# Patient Record
Sex: Male | Born: 2000 | Hispanic: No | Marital: Single | Smoking: Never smoker
Health system: Southern US, Community
[De-identification: ages and names within clinical notes are randomized; demographics above are authoritative.]

## PROBLEM LIST (undated history)

## (undated) DIAGNOSIS — Z789 Other specified health status: Secondary | ICD-10-CM

## (undated) DIAGNOSIS — A281 Cat-scratch disease: Secondary | ICD-10-CM

## (undated) DIAGNOSIS — T300 Burn of unspecified body region, unspecified degree: Secondary | ICD-10-CM

## (undated) DIAGNOSIS — S52501A Unspecified fracture of the lower end of right radius, initial encounter for closed fracture: Secondary | ICD-10-CM

## (undated) DIAGNOSIS — S52601A Unspecified fracture of lower end of right ulna, initial encounter for closed fracture: Secondary | ICD-10-CM

## (undated) HISTORY — DX: Burn of unspecified body region, unspecified degree: T30.0

## (undated) HISTORY — PX: OTHER SURGICAL HISTORY: SHX169

## (undated) HISTORY — DX: Unspecified fracture of lower end of right ulna, initial encounter for closed fracture: S52.601A

## (undated) HISTORY — DX: Other specified health status: Z78.9

## (undated) HISTORY — DX: Unspecified fracture of the lower end of right radius, initial encounter for closed fracture: S52.501A

---
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
## 2022-07-09 ENCOUNTER — Telehealth: Payer: Self-pay

## 2022-07-09 NOTE — Telephone Encounter (Signed)
LVM for patient to call back to schedule apt. AS, CMA 

## 2022-09-10 ENCOUNTER — Ambulatory Visit (INDEPENDENT_AMBULATORY_CARE_PROVIDER_SITE_OTHER): Payer: Medicaid Other | Admitting: Pediatrics

## 2022-09-10 ENCOUNTER — Other Ambulatory Visit (HOSPITAL_COMMUNITY)
Admission: RE | Admit: 2022-09-10 | Discharge: 2022-09-10 | Disposition: A | Discharge status: Home / Self Care | Payer: Medicaid Other | Source: Ambulatory Visit | Attending: Pediatrics | Admitting: Pediatrics

## 2022-09-10 ENCOUNTER — Encounter: Payer: Self-pay | Admitting: Pediatrics

## 2022-09-10 ENCOUNTER — Ambulatory Visit (INDEPENDENT_AMBULATORY_CARE_PROVIDER_SITE_OTHER): Payer: Medicaid Other | Admitting: Licensed Clinical Social Worker

## 2022-09-10 VITALS — BP 110/74 | HR 94 | Ht 64.45 in | Wt 126.4 lb

## 2022-09-10 DIAGNOSIS — Z1339 Encounter for screening examination for other mental health and behavioral disorders: Secondary | ICD-10-CM

## 2022-09-10 DIAGNOSIS — Z1331 Encounter for screening for depression: Secondary | ICD-10-CM

## 2022-09-10 DIAGNOSIS — Z113 Encounter for screening for infections with a predominantly sexual mode of transmission: Secondary | ICD-10-CM | POA: Insufficient documentation

## 2022-09-10 DIAGNOSIS — Z6821 Body mass index (BMI) 21.0-21.9, adult: Secondary | ICD-10-CM

## 2022-09-10 DIAGNOSIS — F4320 Adjustment disorder, unspecified: Secondary | ICD-10-CM

## 2022-09-10 DIAGNOSIS — F439 Reaction to severe stress, unspecified: Secondary | ICD-10-CM

## 2022-09-10 DIAGNOSIS — Z114 Encounter for screening for human immunodeficiency virus [HIV]: Secondary | ICD-10-CM | POA: Diagnosis not present

## 2022-09-10 DIAGNOSIS — Z0001 Encounter for general adult medical examination with abnormal findings: Secondary | ICD-10-CM

## 2022-09-10 LAB — POCT RAPID HIV: Rapid HIV, POC: NEGATIVE
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
## 2022-09-10 NOTE — Patient Instructions (Signed)
Adult Primary Care Clinics Name Criteria Services   [facility]  Address: [address] [place], [place] [address]  Phone: [phone] Hours: Monday - Friday 9 AM -6 PM  Types of insurance accepted:  Commercial insurance [facility] (orange card) Medicaid Medicare Uninsured  Language services:  Video and phone interpreters available   Ages 18 and older    Adult primary care Onsite pharmacy Integrated behavioral health Financial assistance counseling Walk-in hours for established patients  Financial assistance counseling hours: Tuesdays 2:00PM - 5:00PM  Thursday 8:30AM - 4:30PM  Space is limited, 10 on Tuesday and 20 on Thursday. It's on first come first serve basis  Name Criteria Services   [facility]  Address: [address] [place], [place] [address]  Phone: [phone]  Hours: Monday - Friday 8:30 AM - 5 PM  Types of insurance accepted:  Commercial insurance Medicaid Medicare Uninsured  Language services:  Video and phone interpreters available   All ages - newborn to adult   Primary care for all ages (children and adults) Integrated behavioral health Nutritionist Financial assistance counseling   Name Criteria Services   [facility]  Located on the ground floor of [facility]  Address: [address]  [place],  [place]  [address]  Phone: [phone]  Hours: Monday - Friday 8:15 AM - 5 PM  Types of insurance accepted:  Commercial insurance Medicaid Medicare Uninsured  Language services:  Video and phone interpreters available   Ages 18 and older   Adult primary care Nutritionist Certified Diabetes Educator  Integrated behavioral health Financial assistance counseling   Name Criteria Services   [facility]  Address: [address] [place], [place] [address]  Phone:  [phone]  Hours: Monday - Friday 8:30 AM - 5 PM    Types of insurance accepted:  Commercial insurance Medicaid Medicare Uninsured  Language services:  Video and phone interpreters available   All ages - newborn to adult   Primary care for all ages (children and adults) Integrated behavioral health Financial assistance counseling    

## 2022-09-11 LAB — URINE CYTOLOGY ANCILLARY ONLY
Chlamydia: NEGATIVE
Comment: NEGATIVE
Comment: NORMAL
Neisseria Gonorrhea: NEGATIVE

## 2022-09-22 ENCOUNTER — Ambulatory Visit: Payer: Medicaid Other | Admitting: Licensed Clinical Social Worker

## 2022-09-22 NOTE — BH Specialist Note (Deleted)
Integrated Behavioral Health Follow Up In-Person Visit  MRN: [id number] Name: Colin Dean  Number of Integrated Behavioral Health Clinician visits: 1- Initial Visit  Session Start time: 1525   Session End time: 1600  Total time in minutes: 35   Types of Service: {CHL AMB TYPE OF SERVICE:[phone]}  Interpretor:{yes WU:[id number]} Interpretor Name and Language: ***  Subjective: Colin Dean is a 22 y.o. male accompanied by {Patient accompanied by:[id number]} Patient was referred by *** for ***. Patient reports the following symptoms/concerns: *** Duration of problem: ***; Severity of problem: {Mild/Moderate/Severe:20260}  Objective: Mood: {BHH MOOD:22306} and Affect: {BHH AFFECT:22307} Risk of harm to self or others: {CHL AMB BH Suicide Current Mental Status:21022748}  Life Context: Family and Social: *** School/Work: *** Self-Care: *** Life Changes: ***  Patient and/or Family's Strengths/Protective Factors: {CHL AMB BH PROTECTIVE [phone]}  Goals Addressed: Patient will:  Reduce symptoms of: {IBH Symptoms:21014056}   Increase knowledge and/or ability of: {IBH Patient Tools:21014057}   Demonstrate ability to: {IBH Goals:21014053}  Progress towards Goals: {CHL AMB BH PROGRESS TOWARDS [phone]}  Interventions: Interventions utilized:  {IBH Interventions:21014054} Standardized Assessments completed: {IBH Screening Tools:21014051}  Patient and/or Family Response: ***  Patient Centered Plan: Patient is on the following Treatment Plan(s): *** Assessment: Patient currently experiencing ***.   Patient may benefit from ***.  Plan: Follow up with behavioral health clinician on : *** Behavioral recommendations: *** Referral(s): {IBH Referrals:21014055} "From scale of 1-10, how likely are you to follow plan?": ***  Isabelle Course, [facility]

## 2024-01-29 ENCOUNTER — Other Ambulatory Visit: Payer: Self-pay

## 2024-01-29 ENCOUNTER — Encounter (HOSPITAL_COMMUNITY): Payer: Self-pay

## 2024-01-29 ENCOUNTER — Emergency Department (HOSPITAL_COMMUNITY)
Admission: EM | Admit: 2024-01-29 | Discharge: 2024-01-29 | Disposition: A | Discharge status: Home / Self Care | Attending: Emergency Medicine | Admitting: Emergency Medicine

## 2024-01-29 DIAGNOSIS — R509 Fever, unspecified: Secondary | ICD-10-CM | POA: Insufficient documentation

## 2024-01-29 LAB — RESP PANEL BY RT-PCR (RSV, FLU A&B, COVID)  RVPGX2
Influenza A by PCR: NEGATIVE
Influenza B by PCR: NEGATIVE
Resp Syncytial Virus by PCR: NEGATIVE
SARS Coronavirus 2 by RT PCR: NEGATIVE

## 2024-01-29 NOTE — ED Triage Notes (Signed)
 First Nurse Note: Pt with fever, HA, vomiting that started last night. Pt states he is here for a work note because they sent him home.

## 2024-01-29 NOTE — ED Provider Triage Note (Signed)
 Emergency Medicine Provider Triage Evaluation Note  Colin Dean , a 23 y.o. male  was evaluated in triage.  Pt complains of reportedly mild frontal headache, runny nose, nasal congestion, and nausea primarily with a few episodes of emesis.  Denies any belly pain, diarrhea, constipation.  Patient reports objective fever but mainly had bodyaches and had trouble sleeping last night.  Brother and dad at home sick with similar symptoms.  Denies any neck pain or stiffness  Review of Systems  Positive:  Negative:   Physical Exam  BP 125/82   Pulse 95   Temp 98.7 F (37.1 C) (Oral)   Resp 14   Ht 5' 4 (1.626 m)   Wt 56.7 kg   SpO2 97%   BMI 21.46 kg/m  Gen:   Awake, no distress   Resp:  Normal effort  MSK:   Moves extremities without difficulty  Other:    Medical Decision Making  Medically screening exam initiated at 1:30 PM.  Appropriate orders placed.  Vikas Wegmann [place] was informed that the remainder of the evaluation will be completed by another provider, this initial triage assessment does not replace that evaluation, and the importance of remaining in the ED until their evaluation is complete. Rester swab ordered    Bernis Ernst, PA-C 01/29/24 1333

## 2024-01-29 NOTE — ED Notes (Signed)
Written and verbal discharge instructions administered. Pt denies any questions or concerns at this time.

## 2024-01-29 NOTE — ED Provider Notes (Signed)
 [place] EMERGENCY DEPARTMENT AT [facility] Provider Note   CSN: [id number] Arrival date & time: 01/29/24  1236     Patient presents with: Fever   Colin Dean is a 23 y.o. male.   Patient to ED with complaint of fever x 1-2 days. He reports mild body aches but no respiratory symptoms, vomiting, diarrhea. He reports similar symptoms going through his household with multiple family members. He is eating and drinking well. He denies headache and vomiting.   The history is provided by the patient. No language interpreter was used.  Fever      Prior to Admission medications   Medication Sig Start Date End Date Taking? Authorizing Provider  Clindamycin -Benzoyl Per, Refr, gel Thin topical layer 1-2 times a day Patient not taking: Reported on 09/10/2022 04/29/20   Leta Crazier, MD  RETIN-A  MICRO 0.1 % gel Apply topically at bedtime. Patient not taking: Reported on 09/10/2022 05/07/20   Leta Crazier, MD    Allergies: Patient has no known allergies.    Review of Systems  Constitutional:  Positive for fever.    Updated Vital Signs BP 125/82   Pulse 95   Temp 98.7 F (37.1 C) (Oral)   Resp 14   Ht 5' 4 (1.626 m)   Wt 56.7 kg   SpO2 97%   BMI 21.46 kg/m   Physical Exam Vitals and nursing note reviewed.  Constitutional:      Appearance: He is well-developed.  HENT:     Head: Normocephalic.     Mouth/Throat:     Mouth: Mucous membranes are moist.  Cardiovascular:     Rate and Rhythm: Normal rate and regular rhythm.     Heart sounds: No murmur heard. Pulmonary:     Effort: Pulmonary effort is normal.     Breath sounds: Normal breath sounds. No wheezing, rhonchi or rales.  Abdominal:     Palpations: Abdomen is soft.     Tenderness: There is no abdominal tenderness. There is no guarding or rebound.  Musculoskeletal:        General: Normal range of motion.     Cervical back: Normal range of motion and neck supple.  Skin:    General: Skin  is warm and dry.  Neurological:     General: No focal deficit present.     Mental Status: He is alert and oriented to person, place, and time.     (all labs ordered are listed, but only abnormal results are displayed) Labs Reviewed  RESP PANEL BY RT-PCR (RSV, FLU A&B, COVID)  [id number]    EKG: None  Radiology: No results found.   Procedures   Medications Ordered in the ED - No data to display  Clinical Course as of 01/29/24 1745  Sat Jan 29, 2024  1743 Patient with fever and bodyaches, denies other symptoms. Specifically, no vomiting, headache, cough, diarrhea. He was asked to leave work today and requires a note to return. Viral panel negative. VSS, afebrile. Otherwise healthy 23 yo patient who is felt appropriate for discharge home.  [SU]    Clinical Course User Index [SU] Odell Balls, PA-C                                 Medical Decision Making       Final diagnoses:  Febrile illness    ED Discharge Orders     None  Odell Balls, PA-C 01/29/24 1745    Elnor Jayson LABOR, DO 02/04/24 1600

## 2024-01-29 NOTE — Discharge Instructions (Signed)
 Take Tylenol and or ibuprofen  for fever. Drink lots of fluids and rest. Follow up with your doctor if needed for persistent symptoms.

## 2024-01-30 ENCOUNTER — Ambulatory Visit (HOSPITAL_COMMUNITY)
Admission: EM | Admit: 2024-01-30 | Discharge: 2024-01-30 | Disposition: A | Discharge status: Home / Self Care | Attending: Nurse Practitioner | Admitting: Nurse Practitioner

## 2024-01-30 ENCOUNTER — Encounter (HOSPITAL_COMMUNITY): Payer: Self-pay

## 2024-01-30 DIAGNOSIS — B084 Enteroviral vesicular stomatitis with exanthem: Secondary | ICD-10-CM

## 2024-01-30 MED ORDER — HYDROCORTISONE 1 % EX CREA: TOPICAL_CREAM | CUTANEOUS | 0 refills | Status: AC

## 2024-01-30 MED ORDER — DIPHENHYDRAMINE HCL 25 MG PO TABS: 25.0000 mg | ORAL_TABLET | Freq: Four times a day (QID) | ORAL | 0 refills | Status: AC | PRN

## 2024-01-30 NOTE — ED Triage Notes (Signed)
 Patient presents to the office for rash on both hands and feet x 2 days.

## 2024-01-30 NOTE — Discharge Instructions (Addendum)
 You were seen today for a rash that is consistent with hand-foot-and-mouth disease (HFMD), which is a contagious viral illness.   It usually improves on its own within 7 to 10 days. Because this is a viral infection, antibiotics will not help. At home, make sure you drink plenty of fluids to stay hydrated. Cold drinks, popsicles, smoothies, and soft foods may feel more comfortable if you develop mouth sores. Avoid acidic, spicy, or carbonated foods and drinks if they cause irritation. Get plenty of rest and limit close contact with others until you have been fever-free for at least 24 hours without fever-reducing medication, are feeling better, and any open blisters have dried or scabbed over.  You may use acetaminophen (Tylenol) or ibuprofen  (Motrin /Advil ) as needed for fever, pain, headache, or body aches, following package instructions. Cool compresses, oatmeal baths, or anti-itch lotions such as calamine may also help with symptom relief.   Follow up with your primary care provider if symptoms are not improving within a week, if you have worsening mouth pain that makes it hard to drink, or if the rash becomes painful, significantly more widespread, or shows signs of infection. Go to the emergency department right away if you develop signs of dehydration such as very dry mouth, dizziness, dark urine or not urinating for 8-10 hours, inability to drink fluids, severe headache or neck stiffness, chest pain, trouble breathing, persistent high fever that doesn't improve with medication, or if your condition rapidly worsens.
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
## 2024-02-05 ENCOUNTER — Encounter (HOSPITAL_COMMUNITY): Payer: Self-pay

## 2024-02-05 ENCOUNTER — Ambulatory Visit (HOSPITAL_COMMUNITY): Admission: EM | Admit: 2024-02-05 | Discharge: 2024-02-05 | Disposition: A | Discharge status: Home / Self Care

## 2024-02-05 DIAGNOSIS — B084 Enteroviral vesicular stomatitis with exanthem: Secondary | ICD-10-CM | POA: Diagnosis not present

## 2024-02-05 NOTE — ED Triage Notes (Signed)
 Patient was treated for hand foot and mouth. States the rashes are getting better. Lesions or still there on the palms and tops of the feet. Patient was supposed to return to work tomorrow and wanted to be cleared for work.

## 2024-02-05 NOTE — Discharge Instructions (Signed)
 Hand-foot-and-mouth lesions appear to be healing well.  You are cleared to return to work.  Keep the area clean and dry.  If they are painful, develop drainage, or concerns for infection please return to clinic for reevaluation.  You can use 25 mg of Benadryl  every 6 hours as needed for any itching.  Wash your hands frequently at work.
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
